# Patient Record
Sex: Male | Born: 1980 | Race: White | Hispanic: No | Marital: Single
Health system: Southern US, Community
[De-identification: ages and names within clinical notes are randomized; demographics above are authoritative.]

---

## 2017-04-04 ENCOUNTER — Emergency Department (HOSPITAL_COMMUNITY)
Admission: EM | Admit: 2017-04-04 | Discharge: 2017-04-04 | Disposition: A | Discharge status: Home / Self Care | Payer: Self-pay | Attending: Emergency Medicine | Admitting: Emergency Medicine

## 2017-04-04 ENCOUNTER — Emergency Department (HOSPITAL_COMMUNITY): Payer: Self-pay

## 2017-04-04 DIAGNOSIS — R112 Nausea with vomiting, unspecified: Secondary | ICD-10-CM | POA: Insufficient documentation

## 2017-04-04 DIAGNOSIS — N202 Calculus of kidney with calculus of ureter: Secondary | ICD-10-CM | POA: Insufficient documentation

## 2017-04-04 DIAGNOSIS — N200 Calculus of kidney: Secondary | ICD-10-CM

## 2017-04-04 LAB — URINALYSIS, ROUTINE W REFLEX MICROSCOPIC
Bacteria, UA: NONE SEEN
Bilirubin Urine: NEGATIVE
Glucose, UA: NEGATIVE mg/dL
Ketones, ur: NEGATIVE mg/dL
Leukocytes, UA: NEGATIVE
Nitrite: NEGATIVE
PH: 6 (ref 5.0–8.0)
Protein, ur: NEGATIVE mg/dL
SPECIFIC GRAVITY, URINE: 1.016 (ref 1.005–1.030)
SQUAMOUS EPITHELIAL / LPF: NONE SEEN

## 2017-04-04 LAB — CBC WITH DIFFERENTIAL/PLATELET
BASOS ABS: 0 10*3/uL (ref 0.0–0.1)
Basophils Relative: 0 %
EOS PCT: 1 %
Eosinophils Absolute: 0.1 10*3/uL (ref 0.0–0.7)
HCT: 43.6 % (ref 39.0–52.0)
Hemoglobin: 15.3 g/dL (ref 13.0–17.0)
LYMPHS PCT: 31 %
Lymphs Abs: 3.4 10*3/uL (ref 0.7–4.0)
MCH: 35 pg — ABNORMAL HIGH (ref 26.0–34.0)
MCHC: 35.1 g/dL (ref 30.0–36.0)
MCV: 99.8 fL (ref 78.0–100.0)
MONO ABS: 0.7 10*3/uL (ref 0.1–1.0)
Monocytes Relative: 6 %
Neutro Abs: 6.8 10*3/uL (ref 1.7–7.7)
Neutrophils Relative %: 62 %
PLATELETS: 369 10*3/uL (ref 150–400)
RBC: 4.37 MIL/uL (ref 4.22–5.81)
RDW: 12.7 % (ref 11.5–15.5)
WBC: 10.9 10*3/uL — ABNORMAL HIGH (ref 4.0–10.5)

## 2017-04-04 LAB — BASIC METABOLIC PANEL
ANION GAP: 9 (ref 5–15)
BUN: 20 mg/dL (ref 6–20)
CALCIUM: 9 mg/dL (ref 8.9–10.3)
CO2: 24 mmol/L (ref 22–32)
Chloride: 105 mmol/L (ref 101–111)
Creatinine, Ser: 1.53 mg/dL — ABNORMAL HIGH (ref 0.61–1.24)
GFR calc Af Amer: >60 mL/min (ref >60)
GFR, EST NON AFRICAN AMERICAN: 57 mL/min — AB (ref >60)
Glucose, Bld: 106 mg/dL — ABNORMAL HIGH (ref 65–99)
Potassium: 3.7 mmol/L (ref 3.5–5.1)
Sodium: 138 mmol/L (ref 135–145)

## 2017-04-04 MED ORDER — FENTANYL CITRATE (PF) 100 MCG/2ML IJ SOLN
50.0000 ug | Freq: Once | INTRAMUSCULAR | Status: AC
  Administered 2017-04-04: 50 ug via INTRAVENOUS
  Filled 2017-04-04: qty 2

## 2017-04-04 MED ORDER — HYDROMORPHONE HCL 1 MG/ML IJ SOLN
1.0000 mg | Freq: Once | INTRAMUSCULAR | Status: AC
  Administered 2017-04-04: 1 mg via INTRAVENOUS
  Filled 2017-04-04: qty 1

## 2017-04-04 MED ORDER — TAMSULOSIN HCL 0.4 MG PO CAPS: 0.4000 mg | ORAL_CAPSULE | Freq: Every day | ORAL | 0 refills | Status: AC

## 2017-04-04 MED ORDER — ONDANSETRON 4 MG PO TBDP: 4.0000 mg | ORAL_TABLET | Freq: Three times a day (TID) | ORAL | 0 refills | Status: AC | PRN

## 2017-04-04 MED ORDER — KETOROLAC TROMETHAMINE 15 MG/ML IJ SOLN
15.0000 mg | Freq: Once | INTRAMUSCULAR | Status: AC
  Administered 2017-04-04: 15 mg via INTRAVENOUS
  Filled 2017-04-04: qty 1

## 2017-04-04 MED ORDER — SODIUM CHLORIDE 0.9 % IV BOLUS (SEPSIS)
1000.0000 mL | Freq: Once | INTRAVENOUS | Status: AC
  Administered 2017-04-04: 1000 mL via INTRAVENOUS

## 2017-04-04 MED ORDER — SODIUM CHLORIDE 0.9 % IV BOLUS (SEPSIS): 1000.0000 mL | Freq: Once | INTRAVENOUS | Status: DC

## 2017-04-04 MED ORDER — OXYCODONE-ACETAMINOPHEN 5-325 MG PO TABS
2.0000 | ORAL_TABLET | Freq: Once | ORAL | Status: AC
  Administered 2017-04-04: 2 via ORAL
  Filled 2017-04-04: qty 2

## 2017-04-04 MED ORDER — OXYCODONE-ACETAMINOPHEN 5-325 MG PO TABS: 1.0000 | ORAL_TABLET | ORAL | 0 refills | Status: AC | PRN

## 2017-04-04 MED ORDER — TAMSULOSIN HCL 0.4 MG PO CAPS
0.4000 mg | ORAL_CAPSULE | Freq: Once | ORAL | Status: AC
  Administered 2017-04-04: 0.4 mg via ORAL
  Filled 2017-04-04: qty 1

## 2017-04-04 NOTE — ED Provider Notes (Signed)
[facility]-EMERGENCY DEPT Provider Note   CSN: [id number] Arrival date & time: 04/04/17  1958     History   Chief Complaint Chief Complaint  Patient presents with  . Flank Pain    HPI Brandon Johnson is a 37 y.o. male.  HPI   Very pleasant 37 year old male here with acute onset of left flank pain.  The patient was in his usual state of health.  He is currently visiting with his husband and they are renovating houses.  He states that he was working in the house throughout the day.  He did not have much water.  He then explains acute onset of aching, cramping, severe left flank pain.  The flank pain radiated down towards his left groin.  It has since moved but remains intermittently severe.  Has had associated nausea and vomiting.  No fevers.  He was well prior to the onset of symptoms.  Denies any changes in his bowel habits.  He does endorse urinary frequency but no overt dysuria.  This all began after the onset of pain.   No past medical history on file.  There are no active problems to display for this patient.        Home Medications    Prior to Admission medications   Medication Sig Start Date End Date Taking? Authorizing Provider  BIKTARVY 50-200-25 MG TABS tablet Take 1 tablet by mouth daily. 03/21/17  Yes [provider]  neomycin-polymyxin-hydrocortisone (CORTISPORIN) OTIC solution Place 1 drop into both ears 4 (four) times daily. 03/21/17  Yes [provider]  ondansetron (ZOFRAN ODT) 4 MG disintegrating tablet Take 1 tablet (4 mg total) by mouth every 8 (eight) hours as needed for nausea or vomiting. 04/04/17   Shaune Pollack, MD  oxyCODONE-acetaminophen (PERCOCET/ROXICET) 5-325 MG tablet Take 1-2 tablets by mouth every 4 (four) hours as needed for severe pain. 04/04/17   Shaune Pollack, MD  tamsulosin (FLOMAX) 0.4 MG CAPS capsule Take 1 capsule (0.4 mg total) by mouth daily for 7 days. 04/04/17 04/11/17  Shaune Pollack, MD    Family  History No family history on file.  Social History Social History   Tobacco Use  . Smoking status: Not on file  Substance Use Topics  . Alcohol use: Not on file  . Drug use: Not on file     Allergies   Patient has no known allergies.   Review of Systems Review of Systems  Constitutional: Positive for fatigue.  Gastrointestinal: Positive for nausea and vomiting.  Genitourinary: Positive for flank pain.  All other systems reviewed and are negative.    Physical Exam Updated Vital Signs BP (!) 117/92   Pulse 69   Temp 97.6 F (36.4 C) (Oral)   Resp 15   SpO2 94%   Physical Exam  Constitutional: He is oriented to person, place, and time. He appears well-developed and well-nourished. He appears distressed.  Appears uncomfortable, in pain, standing  HENT:  Head: Normocephalic and atraumatic.  Eyes: Conjunctivae are normal.  Neck: Neck supple.  Cardiovascular: Normal rate, regular rhythm and normal heart sounds. Exam reveals no friction rub.  No murmur heard. Pulmonary/Chest: Effort normal and breath sounds normal. No respiratory distress. He has no wheezes. He has no rales.  Abdominal: Soft. He exhibits no distension.  Moderate left CVA tenderness and tenderness over left upper quadrant.  No rebound or guarding.  Musculoskeletal: He exhibits no edema.  Neurological: He is alert and oriented to person, place, and time. He exhibits  normal muscle tone.  Skin: Skin is warm. Capillary refill takes less than 2 seconds.  Psychiatric: He has a normal mood and affect.  Nursing note and vitals reviewed.    ED Treatments / Results  Labs (all labs ordered are listed, but only abnormal results are displayed) Labs Reviewed  CBC WITH DIFFERENTIAL/PLATELET - Abnormal; Notable for the following components:      Result Value   WBC 10.9 (*)    MCH 35.0 (*)    All other components within normal limits  BASIC METABOLIC PANEL - Abnormal; Notable for the following components:    Glucose, Bld 106 (*)    Creatinine, Ser 1.53 (*)    GFR calc non Af Amer 57 (*)    All other components within normal limits  URINALYSIS, ROUTINE W REFLEX MICROSCOPIC - Abnormal; Notable for the following components:   Color, Urine STRAW (*)    Hgb urine dipstick MODERATE (*)    All other components within normal limits    EKG  EKG Interpretation None       Radiology Ct Renal Stone Study  Result Date: 04/04/2017 CLINICAL DATA:  Sudden onset sharp flank pain radiating to the groin. Laterality is not indicated. EXAM: CT ABDOMEN AND PELVIS WITHOUT CONTRAST TECHNIQUE: Multidetector CT imaging of the abdomen and pelvis was performed following the standard protocol without IV contrast. COMPARISON:  None. FINDINGS: Lower chest: Slight atelectasis in the lung bases. Hepatobiliary: No focal liver abnormality is seen. No gallstones, gallbladder wall thickening, or biliary dilatation. Pancreas: Unremarkable. No pancreatic ductal dilatation or surrounding inflammatory changes. Spleen: Normal in size without focal abnormality. Adrenals/Urinary Tract: No adrenal gland nodules. Stone in the lower pole right kidney measuring 4 mm diameter. No hydronephrosis of either kidney. Left ureter is mildly dilated. Stone in the distal left ureter just above the ureterovesical junction measuring 5 mm diameter. Mild infiltration around the distal left ureter. Bladder is unremarkable. Stomach/Bowel: Stomach is within normal limits. Appendix appears normal. No evidence of bowel wall thickening, distention, or inflammatory changes. Vascular/Lymphatic: No significant vascular findings are present. No enlarged abdominal or pelvic lymph nodes. Reproductive: Prostate is unremarkable. Other: No abdominal wall hernia or abnormality. No abdominopelvic ascites. Musculoskeletal: No acute or significant osseous findings. IMPRESSION: 1. 5 mm stone in the distal left ureter with minimal proximal obstruction. 2. 4 mm nonobstructing stone  in the lower pole right kidney. Electronically Signed   By: Burman NievesWilliam  Stevens M.D.   On: 04/04/2017 21:23    Procedures Procedures (including critical care time)  Medications Ordered in ED Medications  sodium chloride 0.9 % bolus 1,000 mL (0 mLs Intravenous Stopped 04/04/17 2215)  sodium chloride 0.9 % bolus 1,000 mL (0 mLs Intravenous Stopped 04/04/17 2215)  HYDROmorphone (DILAUDID) injection 1 mg (1 mg Intravenous Given 04/04/17 2058)  ketorolac (TORADOL) 15 MG/ML injection 15 mg (15 mg Intravenous Given 04/04/17 2058)  HYDROmorphone (DILAUDID) injection 1 mg (1 mg Intravenous Given 04/04/17 2141)  ketorolac (TORADOL) 15 MG/ML injection 15 mg (15 mg Intravenous Given 04/04/17 2141)  tamsulosin (FLOMAX) capsule 0.4 mg (0.4 mg Oral Given 04/04/17 2142)  oxyCODONE-acetaminophen (PERCOCET/ROXICET) 5-325 MG per tablet 2 tablet (2 tablets Oral Given 04/04/17 2220)  fentaNYL (SUBLIMAZE) injection 50 mcg (50 mcg Intravenous Given 04/04/17 2321)     Initial Impression / Assessment and Plan / ED Course  I have reviewed the triage vital signs and the nursing notes.  Pertinent labs & imaging results that were available during my care of the patient were reviewed  by me and considered in my medical decision making (see chart for details).     37 year old male here with acute onset of severe flank pain.  Imaging is consistent with a 5 mm obstructing stone.  He has no fever, no infectious symptoms and I do not suspect infected stone.  Following IV fluids and pain control, he feels improved.  He has persistent pain, but would like to attempt outpatient management, which I feel is reasonable.  Is been given Flomax here.  Otherwise, no apparent emergent pathologies.  I instructed him on returning with intractable pain, vomiting, or any infectious symptoms.  Of note, he does have history of HIV on retroviral's, which does predispose him to kidney stones.  I asked him to discuss with his PCP/infectious disease  specialist.  Encourage fluids.  Final Clinical Impressions(s) / ED Diagnoses   Final diagnoses:  Kidney stone    ED Discharge Orders        Ordered    oxyCODONE-acetaminophen (PERCOCET/ROXICET) 5-325 MG tablet  Every 4 hours PRN     04/04/17 2318    ondansetron (ZOFRAN ODT) 4 MG disintegrating tablet  Every 8 hours PRN     04/04/17 2318    tamsulosin (FLOMAX) 0.4 MG CAPS capsule  Daily     04/04/17 2318       Shaune Pollack, MD 04/04/17 2339

## 2017-04-04 NOTE — ED Triage Notes (Signed)
Unable to update medical, surgical, social, allergies and PTA meds at this time due to pt's condition.

## 2017-04-04 NOTE — ED Triage Notes (Signed)
Pt presents in mild pain induced distress, reporting a sudden onset sharp flank pain that "took the air out of me." States the pain radiates to his groin.

## 2017-04-04 NOTE — ED Notes (Signed)
Fluide challenge done. Patient tolerated it well.

## 2018-12-03 IMAGING — CT CT RENAL STONE PROTOCOL
2 of 4 series · 16 of 46 positions shown, 18 images · non-contrast
Comparison: None.

CLINICAL DATA: Sudden onset sharp flank pain radiating to the
groin. Laterality is not indicated.

EXAM:
CT ABDOMEN AND PELVIS WITHOUT CONTRAST
TECHNIQUE: Multidetector CT imaging of the abdomen and pelvis was performed
following the standard protocol without IV contrast.

[Series 2: axial st · axial · 0.82mm/px · z∈[+1083,+1488]mm · 13 of 93 slices shown, 15 images]
[im 6/93  soft-tissue]
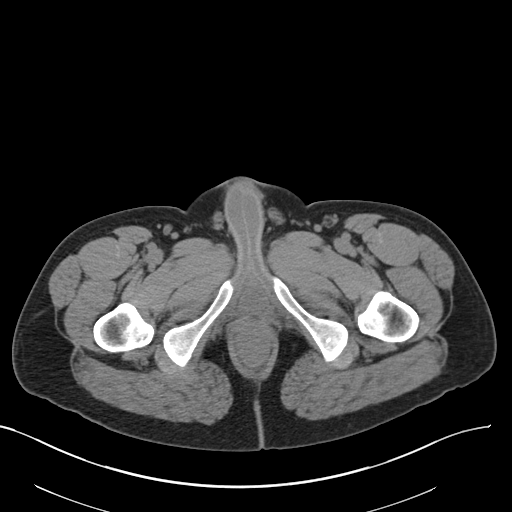
[im 6/93  bone]
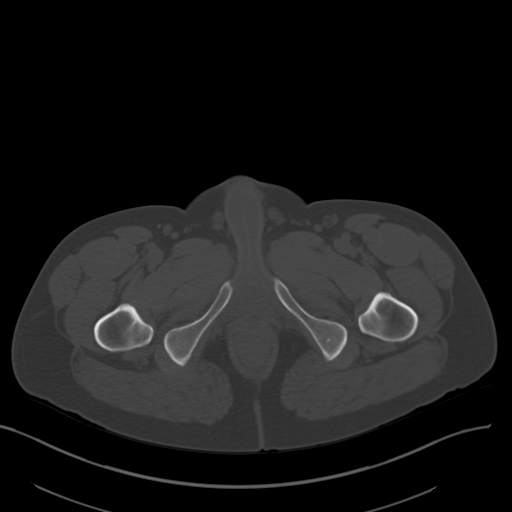
[im 11/93  soft-tissue]
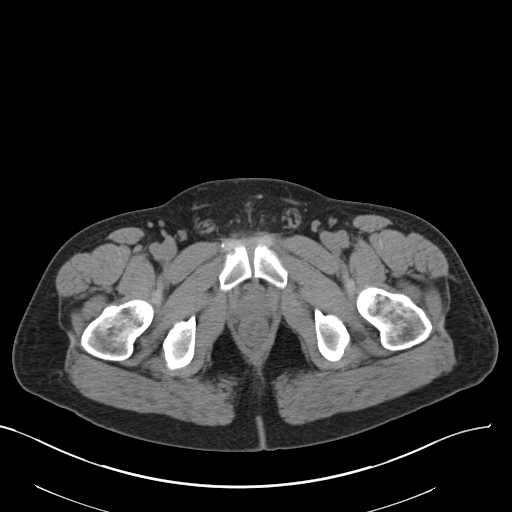
[im 22/93  soft-tissue]
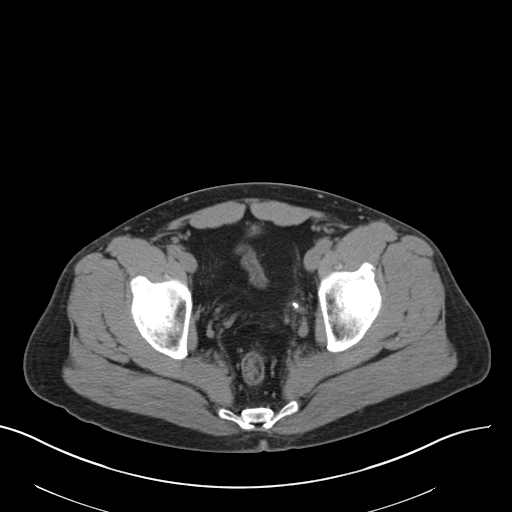
[im 28/93  soft-tissue]
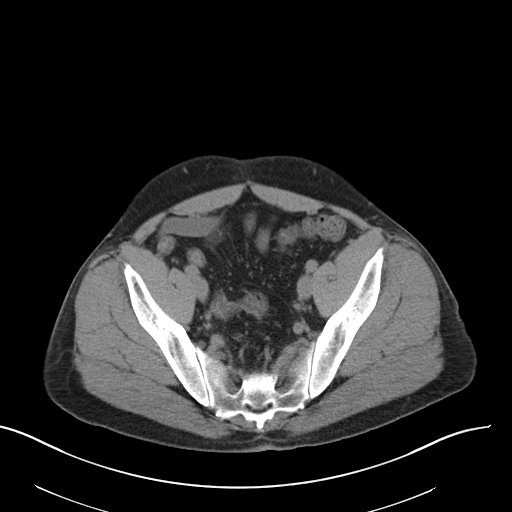
[im 33/93  soft-tissue]
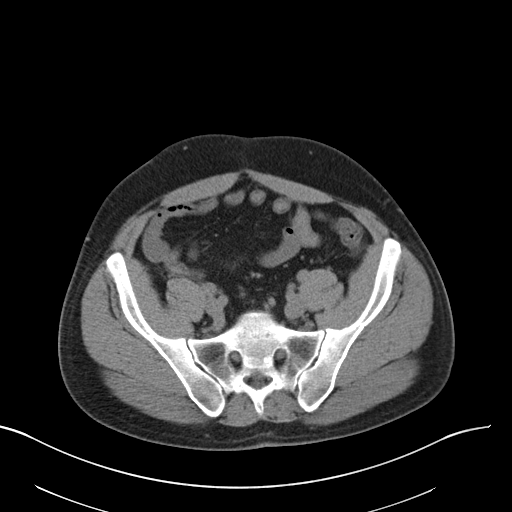
[im 38/93  soft-tissue]
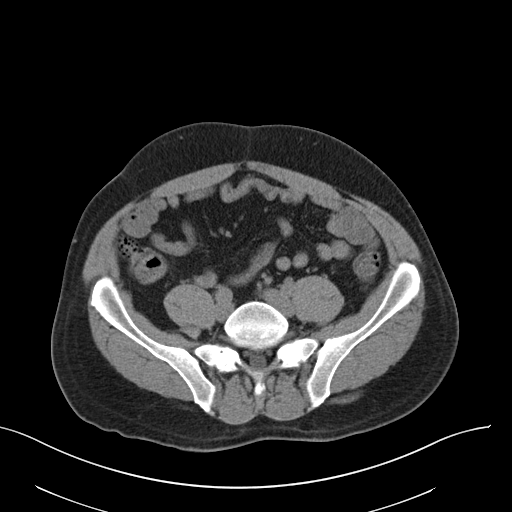
[im 49/93  soft-tissue]
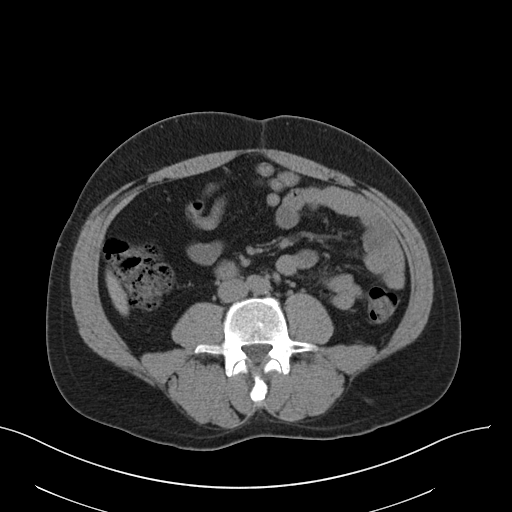
[im 55/93  soft-tissue]
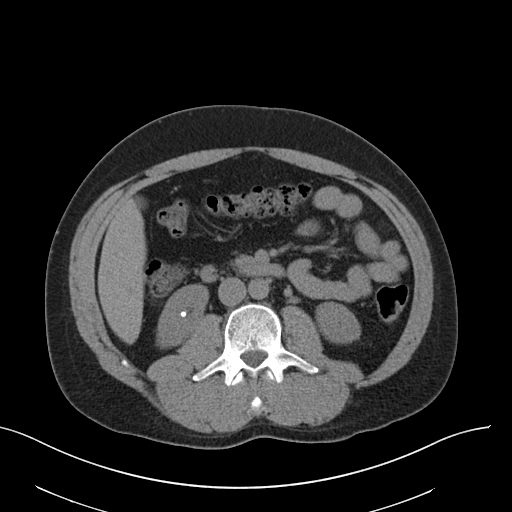
[im 60/93  soft-tissue]
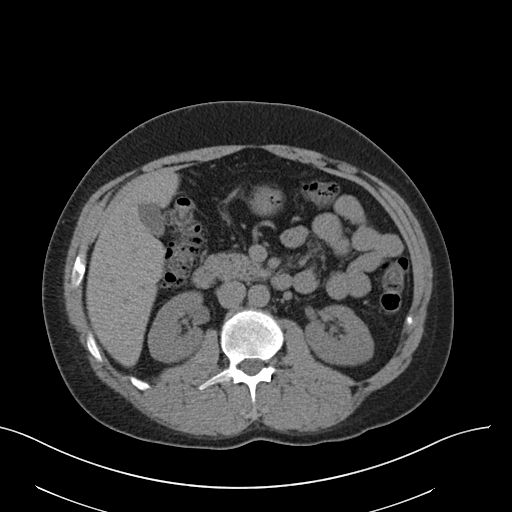
[im 60/93  bone]
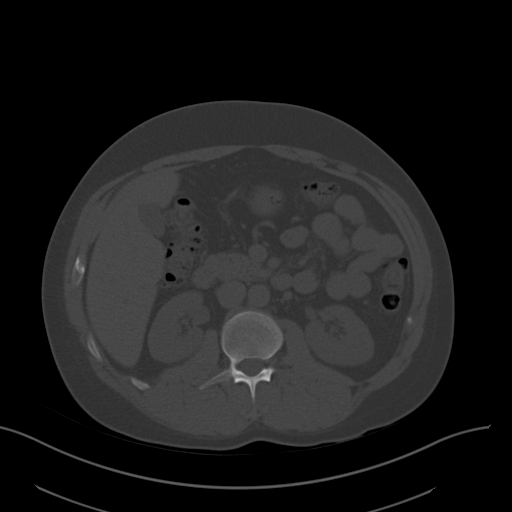
[im 65/93  soft-tissue]
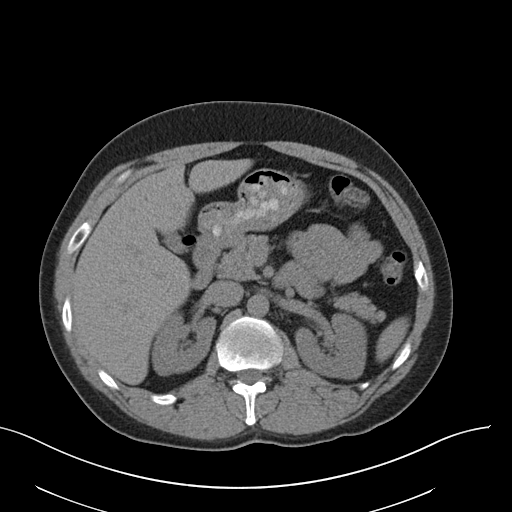
[im 71/93  soft-tissue]
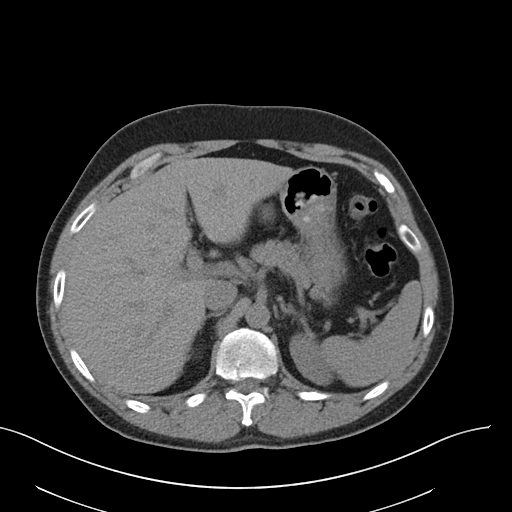
[im 82/93  soft-tissue]
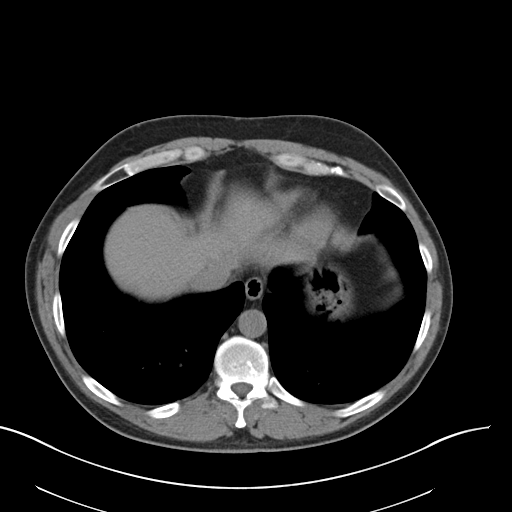
[im 87/93  soft-tissue]
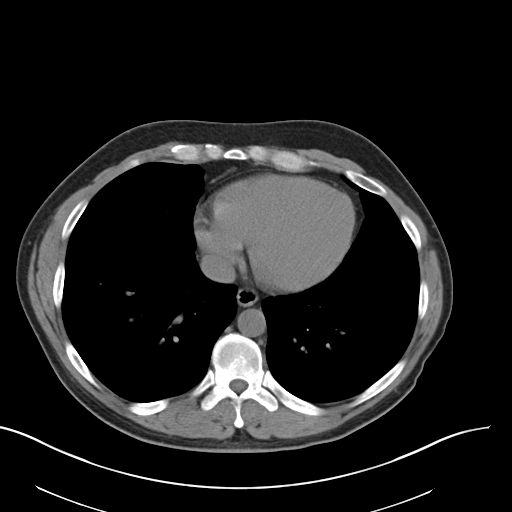

[Series 5: coronal · coronal · 0.74mm/px · 3 of 157 slices shown]
[im 53/157  soft-tissue]
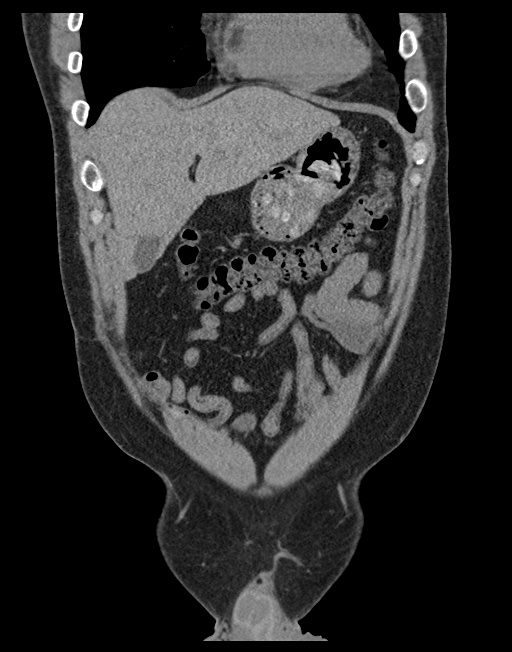
[im 70/157  soft-tissue]
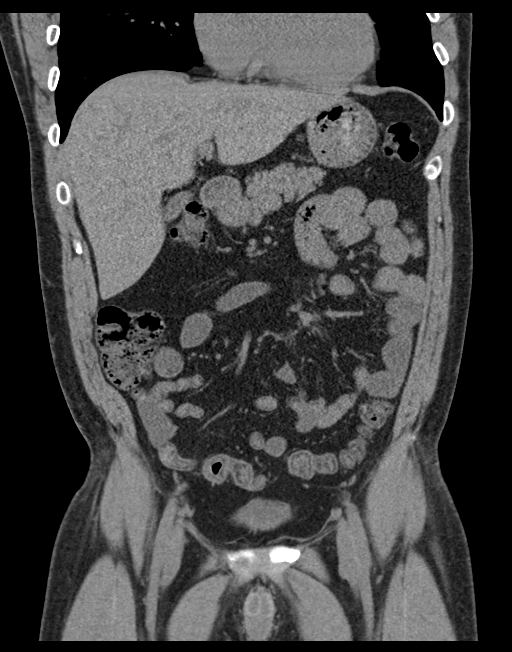
[im 87/157  soft-tissue]
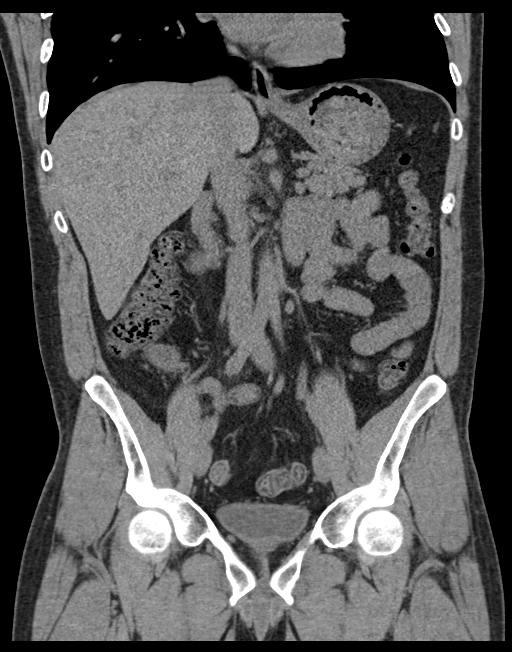

[16 of 46 positions shown; findings below may reference images not displayed]

FINDINGS: Lower chest: Slight atelectasis in the lung bases.

Hepatobiliary: No focal liver abnormality is seen. No gallstones,
gallbladder wall thickening, or biliary dilatation.

Pancreas: Unremarkable. No pancreatic ductal dilatation or
surrounding inflammatory changes.

Spleen: Normal in size without focal abnormality.

Adrenals/Urinary Tract: No adrenal gland nodules. Stone in the lower
pole right kidney measuring 4 mm diameter. No hydronephrosis of
either kidney. Left ureter is mildly dilated. Stone in the distal
left ureter just above the ureterovesical junction measuring 5 mm
diameter. Mild infiltration around the distal left ureter. Bladder
is unremarkable.

Stomach/Bowel: Stomach is within normal limits. Appendix appears
normal. No evidence of bowel wall thickening, distention, or
inflammatory changes.

Vascular/Lymphatic: No significant vascular findings are present. No
enlarged abdominal or pelvic lymph nodes.

Reproductive: Prostate is unremarkable.

Other: No abdominal wall hernia or abnormality. No abdominopelvic
ascites.

Musculoskeletal: No acute or significant osseous findings.
IMPRESSION: 1. 5 mm stone in the distal left ureter with minimal proximal
obstruction.
2. 4 mm nonobstructing stone in the lower pole right kidney.
